# Patient Record
Sex: Male | Born: 1940 | Race: White | Hispanic: No | Marital: Married | Smoking: Former smoker
Health system: Southern US, Community
[De-identification: ages and names within clinical notes are randomized; demographics above are authoritative.]

## PROBLEM LIST (undated history)

## (undated) DIAGNOSIS — J690 Pneumonitis due to inhalation of food and vomit: Secondary | ICD-10-CM

## (undated) DIAGNOSIS — G20 Parkinson's disease: Secondary | ICD-10-CM

## (undated) DIAGNOSIS — J449 Chronic obstructive pulmonary disease, unspecified: Secondary | ICD-10-CM

## (undated) DIAGNOSIS — F419 Anxiety disorder, unspecified: Secondary | ICD-10-CM

## (undated) DIAGNOSIS — J189 Pneumonia, unspecified organism: Secondary | ICD-10-CM

## (undated) DIAGNOSIS — F32A Depression, unspecified: Secondary | ICD-10-CM

## (undated) DIAGNOSIS — M1990 Unspecified osteoarthritis, unspecified site: Secondary | ICD-10-CM

## (undated) DIAGNOSIS — F329 Major depressive disorder, single episode, unspecified: Secondary | ICD-10-CM

## (undated) DIAGNOSIS — J45909 Unspecified asthma, uncomplicated: Secondary | ICD-10-CM

## (undated) DIAGNOSIS — C44519 Basal cell carcinoma of skin of other part of trunk: Secondary | ICD-10-CM

## (undated) HISTORY — PX: HERNIA REPAIR: SHX51

## (undated) HISTORY — PX: ABDOMINAL HERNIA REPAIR: SHX539

## (undated) HISTORY — PX: BASAL CELL CARCINOMA EXCISION: SHX1214

---

## 2001-03-13 ENCOUNTER — Encounter: Payer: Self-pay | Admitting: Emergency Medicine

## 2001-03-13 ENCOUNTER — Emergency Department (HOSPITAL_COMMUNITY): Admission: EM | Admit: 2001-03-13 | Discharge: 2001-03-13 | Payer: Self-pay | Admitting: *Deleted

## 2001-10-26 ENCOUNTER — Ambulatory Visit (HOSPITAL_BASED_OUTPATIENT_CLINIC_OR_DEPARTMENT_OTHER): Admission: RE | Admit: 2001-10-26 | Discharge: 2001-10-26 | Payer: Self-pay | Admitting: Otolaryngology

## 2017-03-12 ENCOUNTER — Inpatient Hospital Stay (HOSPITAL_COMMUNITY)
Admission: EM | Admit: 2017-03-12 | Discharge: 2017-03-14 | DRG: 871 | Disposition: A | Discharge status: Hospice | Payer: Medicare Other | Attending: Internal Medicine | Admitting: Internal Medicine

## 2017-03-12 ENCOUNTER — Encounter (HOSPITAL_COMMUNITY): Payer: Self-pay

## 2017-03-12 ENCOUNTER — Emergency Department (HOSPITAL_COMMUNITY): Payer: Medicare Other

## 2017-03-12 DIAGNOSIS — Z6822 Body mass index (BMI) 22.0-22.9, adult: Secondary | ICD-10-CM | POA: Diagnosis not present

## 2017-03-12 DIAGNOSIS — J181 Lobar pneumonia, unspecified organism: Secondary | ICD-10-CM

## 2017-03-12 DIAGNOSIS — F0390 Unspecified dementia without behavioral disturbance: Secondary | ICD-10-CM | POA: Diagnosis present

## 2017-03-12 DIAGNOSIS — Z79899 Other long term (current) drug therapy: Secondary | ICD-10-CM

## 2017-03-12 DIAGNOSIS — L89151 Pressure ulcer of sacral region, stage 1: Secondary | ICD-10-CM | POA: Diagnosis present

## 2017-03-12 DIAGNOSIS — Z515 Encounter for palliative care: Secondary | ICD-10-CM | POA: Diagnosis present

## 2017-03-12 DIAGNOSIS — R0602 Shortness of breath: Secondary | ICD-10-CM

## 2017-03-12 DIAGNOSIS — G20 Parkinson's disease: Secondary | ICD-10-CM | POA: Diagnosis present

## 2017-03-12 DIAGNOSIS — E43 Unspecified severe protein-calorie malnutrition: Secondary | ICD-10-CM | POA: Diagnosis present

## 2017-03-12 DIAGNOSIS — J449 Chronic obstructive pulmonary disease, unspecified: Secondary | ICD-10-CM | POA: Diagnosis present

## 2017-03-12 DIAGNOSIS — Z87891 Personal history of nicotine dependence: Secondary | ICD-10-CM | POA: Diagnosis not present

## 2017-03-12 DIAGNOSIS — I10 Essential (primary) hypertension: Secondary | ICD-10-CM | POA: Diagnosis present

## 2017-03-12 DIAGNOSIS — R1310 Dysphagia, unspecified: Secondary | ICD-10-CM

## 2017-03-12 DIAGNOSIS — Z8701 Personal history of pneumonia (recurrent): Secondary | ICD-10-CM

## 2017-03-12 DIAGNOSIS — Z7982 Long term (current) use of aspirin: Secondary | ICD-10-CM | POA: Diagnosis not present

## 2017-03-12 DIAGNOSIS — Z66 Do not resuscitate: Secondary | ICD-10-CM | POA: Diagnosis present

## 2017-03-12 DIAGNOSIS — J690 Pneumonitis due to inhalation of food and vomit: Secondary | ICD-10-CM | POA: Diagnosis present

## 2017-03-12 DIAGNOSIS — I700 Atherosclerosis of aorta: Secondary | ICD-10-CM | POA: Diagnosis present

## 2017-03-12 DIAGNOSIS — F0280 Dementia in other diseases classified elsewhere without behavioral disturbance: Secondary | ICD-10-CM

## 2017-03-12 DIAGNOSIS — Z931 Gastrostomy status: Secondary | ICD-10-CM | POA: Diagnosis not present

## 2017-03-12 DIAGNOSIS — Z7189 Other specified counseling: Secondary | ICD-10-CM

## 2017-03-12 DIAGNOSIS — A419 Sepsis, unspecified organism: Secondary | ICD-10-CM | POA: Diagnosis present

## 2017-03-12 DIAGNOSIS — N400 Enlarged prostate without lower urinary tract symptoms: Secondary | ICD-10-CM | POA: Diagnosis present

## 2017-03-12 DIAGNOSIS — J189 Pneumonia, unspecified organism: Secondary | ICD-10-CM

## 2017-03-12 DIAGNOSIS — R0603 Acute respiratory distress: Secondary | ICD-10-CM

## 2017-03-12 HISTORY — DX: Basal cell carcinoma of skin of other part of trunk: C44.519

## 2017-03-12 HISTORY — DX: Major depressive disorder, single episode, unspecified: F32.9

## 2017-03-12 HISTORY — DX: Pneumonitis due to inhalation of food and vomit: J69.0

## 2017-03-12 HISTORY — DX: Unspecified osteoarthritis, unspecified site: M19.90

## 2017-03-12 HISTORY — DX: Parkinson's disease: G20

## 2017-03-12 HISTORY — DX: Anxiety disorder, unspecified: F41.9

## 2017-03-12 HISTORY — DX: Unspecified asthma, uncomplicated: J45.909

## 2017-03-12 HISTORY — DX: Chronic obstructive pulmonary disease, unspecified: J44.9

## 2017-03-12 HISTORY — DX: Depression, unspecified: F32.A

## 2017-03-12 HISTORY — DX: Pneumonia, unspecified organism: J18.9

## 2017-03-12 LAB — URINALYSIS, ROUTINE W REFLEX MICROSCOPIC
BILIRUBIN URINE: NEGATIVE
Bacteria, UA: NONE SEEN
GLUCOSE, UA: NEGATIVE mg/dL
Hgb urine dipstick: NEGATIVE
KETONES UR: NEGATIVE mg/dL
LEUKOCYTES UA: NEGATIVE
NITRITE: NEGATIVE
PH: 6 (ref 5.0–8.0)
Protein, ur: 30 mg/dL — AB
Specific Gravity, Urine: 1.023 (ref 1.005–1.030)

## 2017-03-12 LAB — CBC WITH DIFFERENTIAL/PLATELET
Basophils Absolute: 0 10*3/uL (ref 0.0–0.1)
Basophils Relative: 0 %
Eosinophils Absolute: 0.3 10*3/uL (ref 0.0–0.7)
Eosinophils Relative: 1 %
HCT: 36.1 % — ABNORMAL LOW (ref 39.0–52.0)
Hemoglobin: 11.4 g/dL — ABNORMAL LOW (ref 13.0–17.0)
Lymphocytes Relative: 5 %
Lymphs Abs: 1.6 10*3/uL (ref 0.7–4.0)
MCH: 29 pg (ref 26.0–34.0)
MCHC: 31.6 g/dL (ref 30.0–36.0)
MCV: 91.9 fL (ref 78.0–100.0)
Monocytes Absolute: 2 10*3/uL — ABNORMAL HIGH (ref 0.1–1.0)
Monocytes Relative: 6 %
Neutro Abs: 28.9 10*3/uL — ABNORMAL HIGH (ref 1.7–7.7)
Neutrophils Relative %: 88 %
Platelets: 188 10*3/uL (ref 150–400)
RBC: 3.93 MIL/uL — ABNORMAL LOW (ref 4.22–5.81)
RDW: 14.8 % (ref 11.5–15.5)
WBC: 32.8 10*3/uL — ABNORMAL HIGH (ref 4.0–10.5)

## 2017-03-12 LAB — COMPREHENSIVE METABOLIC PANEL
ALT: 19 U/L (ref 17–63)
AST: 18 U/L (ref 15–41)
Albumin: 2.6 g/dL — ABNORMAL LOW (ref 3.5–5.0)
Alkaline Phosphatase: 89 U/L (ref 38–126)
Anion gap: 10 (ref 5–15)
BUN: 56 mg/dL — ABNORMAL HIGH (ref 6–20)
CO2: 22 mmol/L (ref 22–32)
Calcium: 8.5 mg/dL — ABNORMAL LOW (ref 8.9–10.3)
Chloride: 111 mmol/L (ref 101–111)
Creatinine, Ser: 1.17 mg/dL (ref 0.61–1.24)
GFR calc Af Amer: >60 mL/min (ref >60)
GFR calc non Af Amer: 58 mL/min — ABNORMAL LOW (ref >60)
Glucose, Bld: 126 mg/dL — ABNORMAL HIGH (ref 65–99)
Potassium: 4.3 mmol/L (ref 3.5–5.1)
Sodium: 143 mmol/L (ref 135–145)
Total Bilirubin: 0.5 mg/dL (ref 0.3–1.2)
Total Protein: 6.2 g/dL — ABNORMAL LOW (ref 6.5–8.1)

## 2017-03-12 LAB — I-STAT CG4 LACTIC ACID, ED
LACTIC ACID, VENOUS: 1.19 mmol/L (ref 0.5–1.9)
Lactic Acid, Venous: 1.59 mmol/L (ref 0.5–1.9)

## 2017-03-12 LAB — I-STAT TROPONIN, ED: Troponin i, poc: 0.02 ng/mL (ref 0.00–0.08)

## 2017-03-12 MED ORDER — PIPERACILLIN-TAZOBACTAM 3.375 G IVPB
3.3750 g | Freq: Three times a day (TID) | INTRAVENOUS | Status: DC
  Administered 2017-03-12 – 2017-03-13 (×2): 3.375 g via INTRAVENOUS
  Filled 2017-03-12 (×3): qty 50

## 2017-03-12 MED ORDER — SODIUM CHLORIDE 0.9 % IV BOLUS (SEPSIS)
1000.0000 mL | Freq: Once | INTRAVENOUS | Status: AC
  Administered 2017-03-12: 1000 mL via INTRAVENOUS

## 2017-03-12 MED ORDER — VANCOMYCIN HCL IN DEXTROSE 750-5 MG/150ML-% IV SOLN
750.0000 mg | Freq: Two times a day (BID) | INTRAVENOUS | Status: DC
  Administered 2017-03-13: 750 mg via INTRAVENOUS
  Filled 2017-03-12 (×4): qty 150

## 2017-03-12 MED ORDER — VANCOMYCIN HCL IN DEXTROSE 750-5 MG/150ML-% IV SOLN
750.0000 mg | INTRAVENOUS | Status: AC
  Administered 2017-03-12: 750 mg via INTRAVENOUS
  Filled 2017-03-12: qty 150

## 2017-03-12 MED ORDER — PIPERACILLIN-TAZOBACTAM 3.375 G IVPB 30 MIN
3.3750 g | Freq: Once | INTRAVENOUS | Status: AC
  Administered 2017-03-12: 3.375 g via INTRAVENOUS
  Filled 2017-03-12: qty 50

## 2017-03-12 MED ORDER — ACETAMINOPHEN 650 MG RE SUPP
650.0000 mg | Freq: Once | RECTAL | Status: AC
  Administered 2017-03-12: 650 mg via RECTAL
  Filled 2017-03-12: qty 1

## 2017-03-12 MED ORDER — VANCOMYCIN HCL IN DEXTROSE 1-5 GM/200ML-% IV SOLN
1000.0000 mg | Freq: Once | INTRAVENOUS | Status: AC
  Administered 2017-03-12: 1000 mg via INTRAVENOUS
  Filled 2017-03-12: qty 200

## 2017-03-12 NOTE — ED Notes (Signed)
Condom cath placed.

## 2017-03-12 NOTE — Progress Notes (Signed)
Pharmacy Antibiotic Note  Darren Ewing is a 76 y.o. male admitted on 03/12/2017 with sepsis, possible PNA.  Pharmacy has been consulted for vancomycin/zosyn dosing. Tmax 101.9, LA 1.59. SCr 1.17 on admit, CrCl~53. Estimated weight ~160-170 lbs in the ED.  Zosyn 70mn infusion and vancomycin 1g IV x 1 already infusing in the ED.  Plan: Zosyn 3.375g IV (3108m inf) x1; then 3.375g IV q8h (4h inf) Vancomycin 175020m1g + additional 750m64mV x 1; then 750mg4mq12h Monitor clinical progress, c/s, renal function F/u de-escalation plan/LOT, vancomycin trough as indicated      Temp (24hrs), Avg:101.9 F (38.8 C), Min:101.9 F (38.8 C), Max:101.9 F (38.8 C)   Recent Labs Lab 03/12/17 1133  LATICACIDVEN 1.59    CrCl cannot be calculated (No order found.).    Allergies  Allergen Reactions  . Coconut Oil Fatty Acid Diethanolamide [Cocamide]     HaleyElicia LamprmD, BCPS Clinical Pharmacist Rx Phone # for today: #[phone] 3:30PM, please call Main Rx: #[id number] 11:51 AM

## 2017-03-12 NOTE — ED Notes (Signed)
Attempted report 

## 2017-03-12 NOTE — ED Provider Notes (Signed)
[facility] EMERGENCY DEPARTMENT Provider Note   CSN: [id number] Arrival date & time: 03/12/17  1053     History   Chief Complaint Chief Complaint  Patient presents with  . Pneumonia  . Fever    HPI Darren Ewing is a 76 y.o. male.  HPI Darren Ewing is a 76 y.o. male presents to ED with complaint of pneumonia. Hx provided by [facility] home staff who I spoke with directly. Pt apparently with recent aspiration problems, has had speech eval. Aspiration pneumonia 3 months ago. Noted to have fever and worsening cough yesterday.  Pt evaluated by PCP and diagnosed with pneumonia, started on levaquin. Today symptoms not improving, so sent here for evaluation. Pt is non verbal and responds to painful stimuli at baseline.   Past Medical History:  Diagnosis Date  . COPD (chronic obstructive pulmonary disease) ([place])   . Parkinson's disease ([place])     There are no active problems to display for this patient.   History reviewed. No pertinent surgical history.     Home Medications    Prior to Admission medications   Not on File    Family History History reviewed. No pertinent family history.  Social History Social History  Substance Use Topics  . Smoking status: Unknown If Ever Smoked  . Smokeless tobacco: Not on file  . Alcohol use No     Allergies   Coconut oil fatty acid diethanolamide [cocamide]   Review of Systems Review of Systems  Unable to perform ROS: Patient nonverbal     Physical Exam Updated Vital Signs BP 118/67   Pulse 77   Temp (!) 101.9 F (38.8 C) (Rectal)   Resp 19   SpO2 100%   Physical Exam  Constitutional: He appears well-developed.  HENT:  Head: Normocephalic and atraumatic.  Eyes: Pupils are equal, round, and reactive to light. Conjunctivae and EOM are normal.  Neck: Normal range of motion. Neck supple.  Cardiovascular: Normal rate, regular rhythm and normal heart sounds.   Pulmonary/Chest: Effort  normal. No respiratory distress. He has no wheezes. He has no rales.  Abdominal: Soft. Bowel sounds are normal. He exhibits no distension. There is no tenderness. There is no rebound.  Musculoskeletal: He exhibits no edema.  Neurological:  Responds to painful stimuli only. Left hand in a splint. Does not follow directions.   Skin: Skin is warm and dry.  Nursing note and vitals reviewed.    ED Treatments / Results  Labs (all labs ordered are listed, but only abnormal results are displayed) Labs Reviewed  CBC WITH DIFFERENTIAL/PLATELET - Abnormal; Notable for the following:       Result Value   WBC 32.8 (*)    RBC 3.93 (*)    Hemoglobin 11.4 (*)    HCT 36.1 (*)    Neutro Abs 28.9 (*)    Monocytes Absolute 2.0 (*)    All other components within normal limits  COMPREHENSIVE METABOLIC PANEL - Abnormal; Notable for the following:    Glucose, Bld 126 (*)    BUN 56 (*)    Calcium 8.5 (*)    Total Protein 6.2 (*)    Albumin 2.6 (*)    GFR calc non Af Amer 58 (*)    All other components within normal limits  URINALYSIS, ROUTINE W REFLEX MICROSCOPIC - Abnormal; Notable for the following:    Protein, ur 30 (*)    Squamous Epithelial / LPF 0-5 (*)    All other  components within normal limits  CULTURE, BLOOD (ROUTINE X 2)  CULTURE, BLOOD (ROUTINE X 2)  URINE CULTURE  I-STAT CG4 LACTIC ACID, ED  I-STAT TROPONIN, ED  I-STAT CG4 LACTIC ACID, ED    EKG  EKG Interpretation  Date/Time:  Wednesday March 12 2017 10:58:58 EDT Ventricular Rate:  82 PR Interval:    QRS Duration: 82 QT Interval:  390 QTC Calculation: 456 R Axis:   -22 Text Interpretation:  Sinus rhythm Left ventricular hypertrophy Normal sinus rhythm Confirmed by Thomasene Lot, [place] [phone]) on 03/12/2017 11:20:29 AM       Radiology Dg Chest Portable 1 View  Result Date: 03/12/2017 CLINICAL DATA:  Shortness of breath with cough and fever EXAM: PORTABLE CHEST 1 VIEW COMPARISON:  None. FINDINGS: There is airspace  consolidation throughout the left lower lobe with small left pleural effusion. There is mild right base atelectasis. Lungs elsewhere clear. Heart is upper normal in size with pulmonary vascularity within normal limits. No adenopathy. There is aortic atherosclerosis. No evident bone lesions. IMPRESSION: Airspace consolidation left lower lobe consistent with pneumonia. Small left pleural effusion. Right base atelectasis. Aortic atherosclerosis. Aortic Atherosclerosis (ICD10-I70.0). Electronically Signed   By: Lowella Grip III M.D.   On: 03/12/2017 11:36    Procedures Procedures (including critical care time)  Medications Ordered in ED Medications  vancomycin (VANCOCIN) IVPB 1000 mg/200 mL premix (1,000 mg Intravenous New Bag/Given 03/12/17 1158)  vancomycin (VANCOCIN) IVPB 750 mg/150 ml premix (not administered)  piperacillin-tazobactam (ZOSYN) IVPB 3.375 g (not administered)  vancomycin (VANCOCIN) IVPB 750 mg/150 ml premix (not administered)  sodium chloride 0.9 % bolus 1,000 mL (not administered)  acetaminophen (TYLENOL) suppository 650 mg (650 mg Rectal Given 03/12/17 1132)  sodium chloride 0.9 % bolus 1,000 mL (1,000 mLs Intravenous New Bag/Given 03/12/17 1130)  piperacillin-tazobactam (ZOSYN) IVPB 3.375 g (0 g Intravenous Stopped 03/12/17 1229)     Initial Impression / Assessment and Plan / ED Course  I have reviewed the triage vital signs and the nursing notes.  Pertinent labs & imaging results that were available during my care of the patient were reviewed by me and considered in my medical decision making (see chart for details).     Patient in emergency department with pneumonia from assisted-living facility. I discussed patient with the facility, appears to be at baseline mental status wise. Recent evaluation for possible aspiration. We'll repeat chest x-ray, labs, administer IV fluids. Vital signs are normal.   Temperature 101.9 rectally. Will administer Tylenol, will start  antibiotics. Initially antibiotics will be started for unknown source of infection. Chest x-ray pending at this time. Labs pending   Vital signs continued to be normal. Chest x-ray showing left lower lobe pneumonia. White blood cell count is impressively elevated, however normal lactic acid. Is not meet sepsis at this time. Will admit, receiving fluids and antibiotics. Discussed plan of admission with family.   Spoke with internal medicine teaching service, they will admit patient.   Vitals:   03/12/17 1454 03/12/17 1500 03/12/17 1502 03/12/17 1530  BP: 130/68 126/71  136/82  Pulse: 84 84  81  Resp: (!) 29 (!) 28  (!) 29  Temp:   99.3 F (37.4 C)   TempSrc:   Oral   SpO2: 96% 93%  97%     Final Clinical Impressions(s) / ED Diagnoses   Final diagnoses:  Community acquired pneumonia of left lower lobe of lung (HCC)    New Prescriptions New Prescriptions   No medications on file  Jeannett Senior, PA-C 03/12/17 1604    Macarthur Critchley, MD 03/17/17 1605

## 2017-03-12 NOTE — ED Notes (Signed)
Family at bedside, reason for delay explained to them.

## 2017-03-12 NOTE — ED Notes (Signed)
Pt is resting-appears comfortable.  Pt is non-verbal per norm.  Waiting for dispo

## 2017-03-12 NOTE — H&P (Signed)
Date: 03/12/2017               Patient Name:  Darren Ewing MRN: [id number]  DOB: [birth year] Age / Sex: 76 y.o., male   PCP: System, Provider Not In         Medical Service: Internal Medicine Teaching Service         Attending Physician: Dr. Heber Barton Rachel Moulds, DO    First Contact: Dr. Vickki Muff Pager: [phone]  Second Contact: Dr. Alphonzo Grieve Pager: [phone]       After Hours (After 5p/  First Contact Pager: [phone]  weekends / holidays): Second Contact Pager: [phone]     Chief Complaint: Pneumonia,   Level 5 caveat pt not able to communicate history taken from wife, and SNF records.    History of Present Illness: patient is a 76 year old male with past medical history of Parkinson's disease, dementia, hypertension, COPD, BPH, dysphagia who presented to the emergency department as a transfer from [facility] skilled nursing facility.  Patient has been treated for several aspiration pneumonias since September I believe this is the second one.  2 days ago the facility noticed the patient was aspirating.  They cut off his feeding tube thought that they might have seen some spitting up.  Yesterday morning they noticed the patient had a fever they performed a chest x-ray which showed a new pneumonia.  They began treatment with Levaquin 750 mg first dose was on October 16.  However patient's respiratory status continued to decline and they felt it was best that he be evaluated in the hospital.  The wife reports that before his first pneumonia back in September he was much more interactive.  She has noticed a gradual decline since then.  He for the most part was able to make eye contact and mouth words to her sometimes able to say a word or 2.  However the last few weeks she has noticed less eye contact, less speaking.   The wife reports that his COPD was never really bad and that she felt he had more of an asthma.  He was a former smoker but quit 35-40 years ago.  He was not on  oxygen at home had an albuterol inhaler as needed.  Patient received a formal diagnosis of Parkinson's around 5 years ago but the wife reports symptoms as far back as 10 years ago.    ED course: Patient arrived mildly tachypneic with a fever of 101.9 with a BP of 103/63.  CBC and CMP were performed showed a leukocytosis of 32.8 thousand, low albumin at 2.6.  Chest x ray demonstrated airspace consolidation throughout the left lower lobe with small left pleural effusion consistent with pneumonia a mild right base atelectasis Patient had lactic acid taken which was 1.59.  Blood cultures were taken, patient was started on empiric vancomycin and Zosyn.  Urinalysis were negative for signs of infection.    Meds:  Current Meds  Medication Sig  . acetaminophen (TYLENOL) 500 MG tablet Place 1,000 mg into feeding tube every 6 (six) hours as needed for mild pain.   Marland Kitchen aspirin 81 MG chewable tablet Place 81 mg into feeding tube daily.  . carbidopa-levodopa (SINEMET IR) 25-100 MG tablet Place 1 tablet into feeding tube every 8 (eight) hours.   . docusate (COLACE) 50 MG/5ML liquid Place 100 mg into feeding tube 2 (two) times daily as needed for mild constipation.  Marland Kitchen guaifenesin (ROBITUSSIN) 100 MG/5ML syrup Take 200 mg by  mouth every 4 (four) hours as needed for cough.  Marland Kitchen ipratropium-albuterol (DUONEB) 0.5-2.5 (3) MG/3ML SOLN Take 3 mLs by nebulization 3 (three) times daily as needed (shortness of breath).  . Magnesium Hydroxide 2400 MG/10ML SUSP Take 10 mLs by mouth daily as needed for mild constipation.  . metoprolol tartrate (LOPRESSOR) 25 MG tablet Place 25 mg into feeding tube 2 (two) times daily. Hold if BP >145/100   . mirtazapine (REMERON) 15 MG tablet Place 15 mg into feeding tube at bedtime.  . Nutritional Supplements (ISOSOURCE PO) Inject 70 mLs into the vein continuous.  Marland Kitchen omeprazole (PRILOSEC) 20 MG capsule Take 20 mg by mouth daily. PEG Tube  . Potassium Chloride 40 MEQ/15ML (20%) SOLN 20 mEq by PEG  Tube route 2 (two) times daily.  . tamsulosin (FLOMAX) 0.4 MG CAPS capsule Take 0.8 mg by mouth daily after breakfast. PEG Tube     Allergies: Allergies as of 03/12/2017  . (No Known Allergies)   Past Medical History:  Diagnosis Date  . COPD (chronic obstructive pulmonary disease) ([place])   . Parkinson's disease ([place])     Family History: History reviewed. No pertinent family history.  Social History:  Social History   Social History  . Marital status: Married    Spouse name: N/A  . Number of children: N/A  . Years of education: N/A   Occupational History  . Not on file.   Social History Main Topics  . Smoking status: Unknown If Ever Smoked  . Smokeless tobacco: Not on file  . Alcohol use No  . Drug use: No  . Sexual activity: Not on file   Other Topics Concern  . Not on file   Social History Narrative  . No narrative on file    Review of Systems: A complete ROS was negative except as per HPI. ROS limited by patient not able to communicate  Physical Exam: Blood pressure 118/65, pulse 84, temperature (!) 101.9 F (38.8 C), temperature source Rectal, resp. rate (!) 24, SpO2 95 %. Physical Exam  Constitutional: He appears lethargic. No distress.  HENT:  Head: Normocephalic and atraumatic.  Eyes: Right eye exhibits no discharge. Left eye exhibits no discharge. No scleral icterus.  Neck: No JVD present.  Cardiovascular: Normal rate, regular rhythm and normal heart sounds.  Exam reveals no friction rub.   No murmur heard. Pulmonary/Chest: Effort normal and breath sounds normal. No stridor. No respiratory distress. He has no wheezes. He has no rales.  Abdominal: Soft. Bowel sounds are normal. He exhibits no distension. There is no tenderness.  Musculoskeletal: He exhibits no edema or deformity.  Neurological: He appears lethargic.  Skin: Skin is warm and dry. He is not diaphoretic.    EKG: personally reviewed my interpretation is sinus rhythm, LVH  CXR:  personally reviewed my interpretation is  airspace consolidation throughout the left lower lobe with small left pleural effusion consistent with pneumonia a mild right base atelectasis    Assessment & Plan by Problem: Active Problems:   CAP (community acquired pneumonia)  75 year old male with past medical history of Parkinson's disease, dementia, hypertension, COPD, BPH, dysphagia, presents with acute episode of recurrent aspiration pneumonia.    Pneumonia: acute episode of recurrent aspiration pneumonia, this is around the third episode in the last 2 months.  Pt found to have left lower lobe pneumonia.  Has had 1 day of Levaquin 750 mg at the skilled nursing facility so far.  -Will continue IV Vanc/Zosyn -patient saturating well on 2  L nasal cannula -Pt needs good respiratory care with frequent suctioning -Pt/Ot  -cardiac monitoring -repeat CBC/BMP -Important to have a discussion with the family on goals of care due to this being a recurrent issue and patient continuing to decline.  COPD: pt not on O2 at home,pts wife feels like he has more like an asthma picture, pt hasn't smoked in around 40 years.  He has a PRN albuterol inhaler that he rarely uses.  -PRN albuterol  Parkinsons: pt appears to be in continual decline per wife, less interactive.   -continue carbidopa-levodopa  Dispo: Admit patient to Inpatient with expected length of stay greater than 2 midnights.  Signed: Katherine Roan, MD 03/12/2017, 2:43 PM  Vickki Muff MD PGY-1 Internal Medicine Pager # [phone]

## 2017-03-12 NOTE — ED Triage Notes (Signed)
[facility]- pt coming from [facility] facility with complaint of pneumonia. Pt is taking levaquin at SNF for pneumonia. Pt running a fever today, RR of 42, BP initially 82 palpated with EMS. IV in place. Pt lethargic.

## 2017-03-13 DIAGNOSIS — L89151 Pressure ulcer of sacral region, stage 1: Secondary | ICD-10-CM | POA: Diagnosis present

## 2017-03-13 DIAGNOSIS — R52 Pain, unspecified: Secondary | ICD-10-CM

## 2017-03-13 LAB — CBC
HEMATOCRIT: 35.3 % — AB (ref 39.0–52.0)
HEMOGLOBIN: 11.1 g/dL — AB (ref 13.0–17.0)
MCH: 29.1 pg (ref 26.0–34.0)
MCHC: 31.4 g/dL (ref 30.0–36.0)
MCV: 92.7 fL (ref 78.0–100.0)
Platelets: 181 10*3/uL (ref 150–400)
RBC: 3.81 MIL/uL — ABNORMAL LOW (ref 4.22–5.81)
RDW: 14.7 % (ref 11.5–15.5)
WBC: 35.1 10*3/uL — AB (ref 4.0–10.5)

## 2017-03-13 LAB — BASIC METABOLIC PANEL
ANION GAP: 10 (ref 5–15)
BUN: 40 mg/dL — AB (ref 6–20)
CHLORIDE: 113 mmol/L — AB (ref 101–111)
CO2: 20 mmol/L — ABNORMAL LOW (ref 22–32)
Calcium: 8.4 mg/dL — ABNORMAL LOW (ref 8.9–10.3)
Creatinine, Ser: 1.1 mg/dL (ref 0.61–1.24)
GFR calc Af Amer: >60 mL/min (ref >60)
GFR calc non Af Amer: >60 mL/min (ref >60)
GLUCOSE: 110 mg/dL — AB (ref 65–99)
POTASSIUM: 3.6 mmol/L (ref 3.5–5.1)
Sodium: 143 mmol/L (ref 135–145)

## 2017-03-13 LAB — MRSA PCR SCREENING: MRSA by PCR: NEGATIVE

## 2017-03-13 MED ORDER — HALOPERIDOL LACTATE 2 MG/ML PO CONC: 0.5000 mg | ORAL | Status: DC | PRN

## 2017-03-13 MED ORDER — IPRATROPIUM-ALBUTEROL 0.5-2.5 (3) MG/3ML IN SOLN: 3.0000 mL | RESPIRATORY_TRACT | Status: DC | PRN

## 2017-03-13 MED ORDER — PANTOPRAZOLE SODIUM 40 MG PO PACK
40.0000 mg | PACK | Freq: Every day | ORAL | Status: DC
  Administered 2017-03-13 – 2017-03-14 (×2): 40 mg
  Filled 2017-03-13: qty 20

## 2017-03-13 MED ORDER — CARBIDOPA-LEVODOPA 25-100 MG PO TABS
1.0000 | ORAL_TABLET | Freq: Three times a day (TID) | ORAL | Status: DC
  Administered 2017-03-13 – 2017-03-14 (×5): 1
  Filled 2017-03-13 (×8): qty 1

## 2017-03-13 MED ORDER — ASPIRIN 81 MG PO CHEW
81.0000 mg | CHEWABLE_TABLET | Freq: Every day | ORAL | Status: DC
  Administered 2017-03-13: 81 mg
  Filled 2017-03-13: qty 1

## 2017-03-13 MED ORDER — ACETAMINOPHEN 160 MG/5ML PO SOLN: 1000.0000 mg | Freq: Four times a day (QID) | ORAL | Status: DC | PRN

## 2017-03-13 MED ORDER — JEVITY 1.2 CAL PO LIQD
1000.0000 mL | ORAL | Status: DC
  Filled 2017-03-13 (×3): qty 1000

## 2017-03-13 MED ORDER — SENNOSIDES-DOCUSATE SODIUM 8.6-50 MG PO TABS
1.0000 | ORAL_TABLET | Freq: Every day | ORAL | Status: DC
  Administered 2017-03-13: 1
  Filled 2017-03-13: qty 1

## 2017-03-13 MED ORDER — ENOXAPARIN SODIUM 40 MG/0.4ML ~~LOC~~ SOLN
40.0000 mg | Freq: Every day | SUBCUTANEOUS | Status: DC
  Administered 2017-03-13: 40 mg via SUBCUTANEOUS
  Filled 2017-03-13: qty 0.4

## 2017-03-13 MED ORDER — TAMSULOSIN HCL 0.4 MG PO CAPS: 0.8000 mg | ORAL_CAPSULE | Freq: Every day | ORAL | Status: DC

## 2017-03-13 MED ORDER — GLYCOPYRROLATE 0.2 MG/ML IJ SOLN
0.2000 mg | INTRAMUSCULAR | Status: DC | PRN
Start: 2017-03-13 — End: 2017-03-14
  Filled 2017-03-13: qty 1

## 2017-03-13 MED ORDER — MORPHINE SULFATE (PF) 4 MG/ML IV SOLN
2.0000 mg | INTRAVENOUS | Status: DC | PRN
  Administered 2017-03-13 – 2017-03-14 (×5): 2 mg via INTRAVENOUS
  Filled 2017-03-13 (×5): qty 1

## 2017-03-13 MED ORDER — POLYVINYL ALCOHOL 1.4 % OP SOLN
1.0000 [drp] | Freq: Four times a day (QID) | OPHTHALMIC | Status: DC | PRN
  Filled 2017-03-13: qty 15

## 2017-03-13 MED ORDER — BIOTENE DRY MOUTH MT LIQD: 15.0000 mL | OROMUCOSAL | Status: DC | PRN

## 2017-03-13 MED ORDER — DOCUSATE SODIUM 50 MG/5ML PO LIQD: 100.0000 mg | Freq: Two times a day (BID) | ORAL | Status: DC | PRN

## 2017-03-13 MED ORDER — GUAIFENESIN 100 MG/5ML PO SOLN: 200.0000 mg | ORAL | Status: DC | PRN

## 2017-03-13 MED ORDER — ONDANSETRON 4 MG PO TBDP: 4.0000 mg | ORAL_TABLET | Freq: Four times a day (QID) | ORAL | Status: DC | PRN

## 2017-03-13 MED ORDER — GLYCOPYRROLATE 1 MG PO TABS
1.0000 mg | ORAL_TABLET | ORAL | Status: DC | PRN
  Filled 2017-03-13: qty 1

## 2017-03-13 MED ORDER — HALOPERIDOL 0.5 MG PO TABS: 0.5000 mg | ORAL_TABLET | ORAL | Status: DC | PRN

## 2017-03-13 MED ORDER — ONDANSETRON HCL 4 MG/2ML IJ SOLN: 4.0000 mg | Freq: Four times a day (QID) | INTRAMUSCULAR | Status: DC | PRN

## 2017-03-13 MED ORDER — HALOPERIDOL LACTATE 5 MG/ML IJ SOLN: 0.5000 mg | INTRAMUSCULAR | Status: DC | PRN

## 2017-03-13 MED ORDER — SODIUM CHLORIDE 0.9 % IV SOLN
3.0000 g | Freq: Four times a day (QID) | INTRAVENOUS | Status: DC
  Filled 2017-03-13 (×2): qty 3

## 2017-03-13 MED ORDER — GLYCOPYRROLATE 0.2 MG/ML IJ SOLN
0.2000 mg | INTRAMUSCULAR | Status: DC | PRN
  Administered 2017-03-14: 0.2 mg via INTRAVENOUS
  Filled 2017-03-13: qty 1

## 2017-03-13 NOTE — Progress Notes (Signed)
Patient's family requested Dr. Heber [place] and myself to come up to the room and discuss goals of care.  The daughter mentions that the patient was requesting that the family "let him go" and asked "why am I still here?"  The family had a group discussion and agreed that the best thing for the patient was to not suffer any further which is very reasonable. We spent the remainder of our time discussing different treatment options and avenues to pursue those options including hospice, palliative care.  The decision was made to stop further aggressive therapies for the pneumonia but instead focus on making the patient comfortable in treating symptoms.

## 2017-03-13 NOTE — Progress Notes (Signed)
PT Cancellation Note  Patient Details Name: Darren Ewing MRN: [id number] DOB: 04-27-1941   Cancelled Treatment:    Reason Eval/Treat Not Completed: Other (comment).  Pt had indicated to family that he does not want extensive measures taken, and will ck tomorrow to see if any reconsideration of tx is done.  If not will dc the PT order.   Ramond Dial 03/13/2017, 2:36 PM   Mee Hives, PT MS Acute Rehab Dept. Number: [place] and [place]

## 2017-03-13 NOTE — Progress Notes (Signed)
   Subjective:  No acute events overnight, patient somewhat more responsive today is making eye contact, trying to speak but mainly muffling words.  We asked if he was in pain he may have said un huh.    Objective:  Vital signs in last 24 hours: Vitals:   03/13/17 0124 03/13/17 0524 03/13/17 0834 03/13/17 0836  BP: 127/74 125/73  124/80  Pulse: 82 77  83  Resp: 14 12  13   Temp: 99.1 F (37.3 C) 98.5 F (36.9 C)  98.3 F (36.8 C)  TempSrc:    Oral  SpO2: 98% 100% 98% 97%  Weight: 160 lb (72.6 kg)      Physical Exam  Eyes: Right eye exhibits no discharge. Left eye exhibits no discharge. No scleral icterus.  Cardiovascular: Normal rate, regular rhythm, normal heart sounds and intact distal pulses.  Exam reveals no gallop and no friction rub.   No murmur heard. Pulmonary/Chest: Effort normal and breath sounds normal. No respiratory distress. He has no wheezes. He has no rales.  Pt did have some gurgling and tried to cough but with difficulty.    Abdominal: Soft. Bowel sounds are normal. He exhibits no distension and no mass. There is no tenderness. There is no guarding.  Peg tube in place, clean dry, non erythematous around site.   Musculoskeletal: He exhibits no edema or deformity.  Neurological:  Able to make some eye contact today.  Pt was trying to talk but very muffled, hard to comprehend.      Assessment/Plan:  Active Problems:   Aspiration pneumonia (HCC)   Sepsis ([place])   Parkinson's disease ([place])   Aortic atherosclerosis ([place])  Pneumonia: acute episode of recurrent aspiration pneumonia   -Pt MRSA neg, Will narrow antibiotic to unasysn -patient was 97% this am on room air -Pt needs good respiratory care with frequent suctioning -Pt/Ot  -cardiac monitoring -Trend CBC -Important to have a discussion with the family on goals of care due to this being a recurrent issue and patient continuing to decline.   COPD: pt not on O2 at home,pts wife feels like he has more like  an asthma picture, pt hasn't smoked in around 40 years.  He has a PRN albuterol inhaler that he rarely uses.   -PRN albuterol   Parkinsons: pt appears to be in continual decline per wife, less interactive.    -continue carbidopa-levodopa  Pain: unsure of where pt is in pain due to difficulty speaking  -Tylenol PRN for mild pain, IV morphine 2mg  every 4 hours if pt appears to be in pain or endorses pain  Dispo: Anticipated discharge in approximately 1-2 day(s).   Katherine Roan, MD 03/13/2017, 10:00 AM Vickki Muff MD PGY-1 Internal Medicine Pager # [phone]

## 2017-03-13 NOTE — Progress Notes (Signed)
Responded to [facility] consult to support patient. Daughter and grandson at bedside and pt.'s wife gone for lunch. Prayed with pt and family .  Provided empathetic listening,emotional, spiritual and grief support.  Patient opened his eyes after prayer. Chaplain available as needed.   03/13/17 1448  Clinical Encounter Type  Visited With Patient and family together  Visit Type Initial;Spiritual support;Patient actively dying  Referral From Chaplain;Nurse  Spiritual Encounters  Spiritual Needs Prayer;Emotional;Grief support  Stress Factors  Family Stress Factors Health changes;Loss  Cristopher Peru, [facility], Pager [phone]

## 2017-03-13 NOTE — Progress Notes (Signed)
PT Cancellation Note  Patient Details Name: Darren Ewing MRN: [id number] DOB: 12/26/1940   Cancelled Treatment:    Reason Eval/Treat Not Completed: Patient's level of consciousness.  Quite lethargic and with family, who state he just arrived to his room very early this AM.  Will try to see him later today.   Ramond Dial 03/13/2017, 8:17 AM    Mee Hives, PT MS Acute Rehab Dept. Number: [place] and [place]

## 2017-03-13 NOTE — Progress Notes (Signed)
Patient received to unit via stretcher, non verbal and lethagic at this time but responds to painful stimuli. Patient placed on a low bed and bed alarm activated. Call bell within reach of patient. Hearing aids removed and placed in a container labelled with patient's label and placed on night stand by patient's bed. Suction set up in room

## 2017-03-13 NOTE — Care Management Note (Signed)
Case Management Note  Patient Details  Name: Darren Ewing MRN: [id number] Date of Birth: 08/24/1940  Subjective/Objective:     CM following for progression and d/c planning.                Action/Plan: 03/13/2017 CM referral for home hospice or residential hospice. Will order CSW referral for possible residential hospice. Await Palliative care recommendations.   Expected Discharge Date:                  Expected Discharge Plan:   (Home with Hospice vs Residential Hospice.)  In-House Referral:  Clinical Social Work  Discharge planning Services  CM Consult  Post Acute Care Choice:    Choice offered to:     DME Arranged:    DME Agency:     HH Arranged:    [place] Agency:     Status of Service:  In process, will continue to follow  If discussed at Long Length of Stay Meetings, dates discussed:    Additional Comments:  Adron Bene, RN 03/13/2017, 4:34 PM

## 2017-03-13 NOTE — Progress Notes (Signed)
Internal Medicine Attending:   I saw and examined the patient. I reviewed the resident's note and I agree with the resident's findings and plan as documented in the resident's note. More interactive this morning- did give a grunt to show understanding but otherwise non verbal.  Improvement in following commands today.  Multiple family and friends at bedside, wife present.  There are concerned he is in some pain which he did appear to agree with but did not localize.  Given improvement in respiratory status I think it is reasonable to add PRN morphine.  Otherwise his prognosis is still guarded but improving.  Agree with D/C of Vanc and change of ZOsyn to Unasyn.

## 2017-03-13 NOTE — Progress Notes (Signed)
Initial Nutrition Assessment  DOCUMENTATION CODES:   Severe malnutrition in context of chronic illness  INTERVENTION:   Tube Feeding:   Jevity 1.2 @ 70 ml/hr providing 2016 kcals, 93 g of protein 1361 mL of free water.   Recommend addition of free water flushes of 25 ml/hr providing total of 1961 mL of free water.    NUTRITION DIAGNOSIS:   Malnutrition (Severe) related to chronic illness (Parkinsons' Disease, dementia, COPD, recurrent aspiration pneumonia) as evidenced by severe depletion of muscle mass, severe depletion of body fat.  GOAL:   Patient will meet greater than or equal to 90% of their needs  MONITOR:   TF tolerance, Labs, Weight trends, Skin  REASON FOR ASSESSMENT:   Consult Enteral/tube feeding initiation and management  ASSESSMENT:   76 yo male admitted with aspiration pneumonia. Pt wih hx of Parkinson's Dz, dementia, HTN, COPD, dysphagia with G-tube placement  Family at bedside. Pt with eyes open but minimally responsive. Pt copious secretions, respiratory at bedside and pt NT suctioned with good success.  Per family, pt does not take anything by mouth. Pt receives Isosource at rate of 70 ml/hr continuously via G-tube (provides 2016 kcals, 91 g of protein, 1361 mL of free water. Family reports that sometimes when visiting pt at SNF, [facility] is not elevated. Family is aware of the increased risk of aspiration when [facility] is not >30 degrees.   Family reports that back in August pt was able to sit up in a chair, get into wheel chair and work some with PT. However over the last 6 weeks pt has been bed bound.   Family is unsure if pt has lost any weight recently; no prior weight encounters listed  Nutrition-Focused physical exam completed. Findings are mild/moderate to severe fat depletion, mild/moderate to severe muscle depletion, and mild edema.   Labs: reviewed Meds: reviewed  Diet Order:  Diet NPO time specified  Skin:  Wound (see comment) (stage I  coccyx)  Last BM:  no documented BM  Height:   Ht Readings from Last 1 Encounters:  03/13/17 5\' 10"  (1.778 m)    Weight:   Wt Readings from Last 1 Encounters:  03/13/17 160 lb 0.9 oz (72.6 kg)    Ideal Body Weight:  75.4 kg  BMI:  Body mass index is 22.97 kg/m.  Estimated Nutritional Needs:   Kcal:  1800-2000 kcals  Protein:  90-100 g  Fluid:  >/= 1.8 L  EDUCATION NEEDS:   No education needs identified at this time  [place], [place], LDN [phone] Pager  [phone] Weekend/On-Call Pager

## 2017-03-13 NOTE — Progress Notes (Signed)
Pharmacy Antibiotic Note  Darren Ewing is a 76 y.o. male admitted on 03/12/2017 with pneumonia and sepsis.  Pharmacy has been consulted for Unasyn dosing. Tmax 101.9 and WBC count continues to trend up to 35.1 from 32.8 on admission. Scr is stable at 1.17. Patient previously on vancomycin and zosyn, but have been discontinued. There is concern for aspiration pneumonia at this time as there is concern patient was spitting up some tube feedings at outside facility. Also noted that patient did receive levaquin x 1 day at outside facility before transfer.   Plan: Unasyn 3g IV q6 hours  Continue to monitor clinical progression, LOT, and cultures  Will continue to follow for plan for de-escalation  Height: _0  (177.8 cm) Weight: 160 lb 0.9 oz (72.6 kg) IBW/kg (Calculated) : 73  Temp (24hrs), Avg:99.2 F (37.3 C), Min:98.3 F (36.8 C), Max:101.9 F (38.8 C)   Recent Labs Lab 03/12/17 1116 03/12/17 1133 03/12/17 1519 03/13/17 0356  WBC 32.8*  --   --  35.1*  CREATININE 1.17  --   --  1.10  LATICACIDVEN  --  1.59 1.19  --     Estimated Creatinine Clearance: 57.8 mL/min (by C-G formula based on SCr of 1.1 mg/dL).    No Known Allergies  Antimicrobials this admission: Vanc 10/17>>10/18 Zosyn 10/17>>10/18 Unasyn 10/18>>   Microbiology results: 10/17 MRSA PCR: Negative  10/17 BCx: pending 10/17 Ucx: pending    Thank you for allowing pharmacy to be a part of this patient's care.  Jalene Mullet, Pharm.D. PGY1 Pharmacy Resident 03/13/2017 10:28 AM Main Pharmacy: [phone]

## 2017-03-14 ENCOUNTER — Encounter (HOSPITAL_COMMUNITY): Payer: Self-pay | Admitting: General Practice

## 2017-03-14 DIAGNOSIS — Z7189 Other specified counseling: Secondary | ICD-10-CM

## 2017-03-14 DIAGNOSIS — G20 Parkinson's disease: Secondary | ICD-10-CM

## 2017-03-14 DIAGNOSIS — Z515 Encounter for palliative care: Secondary | ICD-10-CM

## 2017-03-14 DIAGNOSIS — J181 Lobar pneumonia, unspecified organism: Secondary | ICD-10-CM

## 2017-03-14 DIAGNOSIS — J189 Pneumonia, unspecified organism: Secondary | ICD-10-CM

## 2017-03-14 DIAGNOSIS — R0602 Shortness of breath: Secondary | ICD-10-CM

## 2017-03-14 MED ORDER — POLYVINYL ALCOHOL 1.4 % OP SOLN: 1.0000 [drp] | Freq: Four times a day (QID) | OPHTHALMIC | 0 refills | Status: AC | PRN

## 2017-03-14 MED ORDER — MORPHINE SULFATE (PF) 4 MG/ML IV SOLN: 2.0000 mg | INTRAVENOUS | 0 refills | Status: AC | PRN

## 2017-03-14 MED ORDER — GLYCOPYRROLATE 0.2 MG/ML IJ SOLN: 0.2000 mg | INTRAMUSCULAR | 0 refills | Status: AC | PRN

## 2017-03-14 MED ORDER — BIOTENE DRY MOUTH MT LIQD: 15.0000 mL | OROMUCOSAL | 0 refills | Status: AC | PRN

## 2017-03-14 MED ORDER — ONDANSETRON 4 MG PO TBDP: ORAL_TABLET | ORAL | 0 refills | Status: AC

## 2017-03-14 NOTE — Progress Notes (Signed)
Internal Medicine Attending:   I saw and examined the patient. I reviewed the resident's note and I agree with the resident's findings and plan as documented in the resident's note. Appear to be resting comfortably, family in room- appreciative, the feels he is more comfortable but has been less responsive this morning.  On my exam, furrowed brow, did not respond to voice or touch,  No respiratory distress, heart RRR. Appreciate palliative cares assistance with transition to inpatient hospice care,  Family is appreciated of all staff at [facility] and express their gratitude. We did discuss option of transition from intermittent IV morphine to morphine drip if needed.

## 2017-03-14 NOTE — Progress Notes (Addendum)
   Subjective:  No acute events overnight, patient not able to communicate today, daughter said pt slept well throughout the night, does not appear to be in pain or discomfort.    Objective:  Vital signs in last 24 hours: Vitals:   03/13/17 1103 03/13/17 1104 03/13/17 2141 03/13/17 2200  BP:   134/77 125/69  Pulse:   81 82  Resp:   11 16  Temp:   97.9 F (36.6 C)   TempSrc:      SpO2: 93% 96% 98% 98%  Weight:      Height:       Physical Exam  Eyes: Right eye exhibits no discharge. Left eye exhibits no discharge. No scleral icterus.  Cardiovascular: Normal rate, regular rhythm, normal heart sounds and intact distal pulses.  Exam reveals no gallop and no friction rub.   No murmur heard. Pulmonary/Chest: Effort normal and breath sounds normal. No respiratory distress. He has no wheezes. He has no rales.  Abdominal: Soft. Bowel sounds are normal. He exhibits no distension and no mass. There is no tenderness. There is no guarding.  Peg tube in place, clean dry, non erythematous around site.   Musculoskeletal: He exhibits no edema or deformity.  Neurological:  Patient back to being unresponsive, not making eye contact    Assessment/Plan:  Active Problems:   Aspiration pneumonia (HCC)   Sepsis ([place])   Parkinson's disease ([place])   Aortic atherosclerosis ([place])   Pressure ulcer of sacral region, stage 1  Pneumonia: acute episode of recurrent aspiration pneumonia   -after discussion with family pt is now full comfort care -hospice/palliative care consulted -palliative discussed with family, decided to try for placement at [facility]  -case management working on referral now   COPD: pt not on O2 at home,pts wife feels like he has more like an asthma picture, pt hasn't smoked in around 40 years.  He has a PRN albuterol inhaler that he rarely uses.   -PRN albuterol   Parkinsons: pt appears to be in continual decline per wife, less interactive.    -continue  carbidopa-levodopa  Pain: unsure of where pt is in pain due to difficulty speaking  -Tylenol PRN for mild pain, IV morphine 2mg  every 4 hours if pt appears to be in pain or endorses pain -palliative care recommends either continue current pain regimen or can consider continuous morphine infusion, with PRN for breakthrough.    Dispo: Anticipated discharge in approximately 1-2 day(s).   Katherine Roan, MD 03/14/2017, 8:04 AM Vickki Muff MD PGY-1 Internal Medicine Pager # [phone]

## 2017-03-14 NOTE — Progress Notes (Signed)
Patient Discharge: Disposition: Patient discharged to [facility]. IV: Peripheral IVs was not removed as per doctor's order. Transportation: patient was transported by the EMS. Belongings: Patient's family took all his belongings with them.

## 2017-03-14 NOTE — Progress Notes (Signed)
Nutrition Brief Note  Chart reviewed. Pt now transitioning to comfort care. Tube Feedings orders were discontinued by MD No further nutrition interventions warranted at this time.  Please re-consult as needed.   Kerman Passey MS, RD, LDN [phone] Pager  [phone] Weekend/On-Call Pager

## 2017-03-14 NOTE — Progress Notes (Signed)
Turned Pt to supine, due to sun shining directly in his face.

## 2017-03-14 NOTE — Consult Note (Signed)
[facility] [place]: Met with family at request of CM Brule at bedside. Discussed pt's poor prognosis, disease progression, goals of care and Hospice home philosophy. Family in agreement to pt transferring to our facility. Daughter signe dconsents. Spoke to Dr. Estill Cotta Hospice medical director they did approve him for GIP level of care. [facility] called at 230pm to pick pt up at 330pm. Nurse update on status and pt has vbeen discharged to Hospice home in [place] [phone]  Lorriane Shire MSW  aware and updated. CK

## 2017-03-14 NOTE — Clinical Social Work Note (Signed)
CSW received consult for residential hospice placement and per wife and daughter, preference is [facility]. Second choice [facility] and they were able to accept patient today. Patient discharged to hospice facility today and transport arranged by  Webb Silversmith, RN with [facility].  Danikah Budzik Givens, MSW, LCSW Licensed Clinical Social Worker [place] [phone]

## 2017-03-14 NOTE — Care Management Note (Signed)
Case Management Note  Patient Details  Name: Darren Ewing MRN: [id number] Date of Birth: December 15, 1940  Subjective/Objective:         CM following for progression and d/c planning..           Action/Plan: 03/14/2017 Per CSW, Crawford Givens pt family has selected residential hospice services. Pt wil d/c to [facility].   Expected Discharge Date:    03/14/2017              Expected Discharge Plan:  [place] (Home with Hospice vs Residential Hospice.)  In-House Referral:  Clinical Social Work  Discharge planning Services  CM Consult  Post Acute Care Choice:   NA Choice offered to:   NA  DME Arranged:   NA DME Agency:   NA  HH Arranged:   NA HH Agency:   NA  Status of Service:  Completed, signed off  If discussed at [employer] [facility] [employer], dates discussed:    Additional Comments:  Adron Bene, RN 03/14/2017, 1:44 PM

## 2017-03-14 NOTE — Consult Note (Signed)
Consultation Note Date: 03/14/2017   Patient Name: Darren Ewing  DOB: 1940/09/14  MRN: [id number]  Age / Sex: 76 y.o., male  PCP: System, Provider Not In Referring Physician: Lucious Groves, DO  Reason for Consultation: Establishing goals of care  HPI/Patient Profile: 76 y.o. male   admitted on 03/12/2017    Clinical Assessment and Goals of Care: 76 yo gentleman, originally from [place], [place], with past medical history significant for Parkinson's disease, [place], has had gradual progressive decline for the past few months with ongoing recurrent aspiration events, requiring hospitalization and SNF rehab attempts.    The patient relocated to [place], [place] with his wife a few weeks ago, during the time [place]. Since then, he has had accelerated functional decline. He came from [facility] facility this time.   The patient is hospitalized now, with pneumonia and sepsis. He was initially placed on antibiotics, it is noted that he has a PEG tube, has been spitting up tube feedings recently.   The patient is noted to have made his wishes clear to his family members, that he did not want to continue current mode of care, he wished to focus exclusively on comfort measures.   The patient has since been on IV Morphine PRN, his tube feedings are d/c, a palliative consult has been placed for additional goals of care discussions.   The patient is resting in bed, he has become non verbal since the past 24 hours or so, he is resting in bed, appears comfortable for now, he has coarse breath sounds, some rattle also going on, his brother and daughters are by his bedside. I introduced myself and palliative care as follows: Palliative medicine is specialized medical care for people living with serious illness. It focuses on providing relief from the symptoms and  stress of a serious illness. The goal is to improve quality of life for both the patient and the family.  The patient's family states that he has suffered a lot of decline from his parkinson's, he has always been clear about his wishes and goals. They would like to continue with comfort measures. We discussed about hospice philosophy of care, the patient, in my opinion, would likely benefit from a residential hospice setting, he might have a prognosis of 2 weeks or less.   CSW and hospice to please call daughter Marcie Bal first [phone].   See below:   NEXT OF KIN  wife, has 3 daughters Originally from [place], [place].  Has moved in with daughter who lives in [place], [place] for the past few weeks, since [place] a few weeks ago.   SUMMARY OF RECOMMENDATIONS    Agree with DNR DNI.  Agree with full comfort care.  Agree with current end-of-life care medications, consider Morphine continuous infusion with availability of bolus dosages if patient has severe symptoms of dyspnea, choking, recurrent aspiration, ok to continue with PRN IV Morphine for now, in  my opinion.   Prognosis: less than 2 weeks, discussed frankly and compassionately with family present in the room.  Disposition: recommend residential hospice, family prefers [place], [place], will initiate CSW consult to help facilitate.   Thank you for the consult.  Code Status/Advance Care Planning:  DNR    Symptom Management:    as above   Palliative Prophylaxis:   Delirium Protocol  Additional Recommendations (Limitations, Scope, Preferences):  Full Comfort Care  Psycho-social/Spiritual:   Desire for further Chaplaincy support:yes  Additional Recommendations: Education on Hospice  Prognosis:   < 2 weeks  Discharge Planning: Hospice facility      Primary Diagnoses: Present on Admission: . Aspiration pneumonia ([place]) . Sepsis ([place]) . Parkinson's disease  ([place]) . Aortic atherosclerosis ([place]) . Pressure ulcer of sacral region, stage 1   I have reviewed the medical record, interviewed the patient and family, and examined the patient. The following aspects are pertinent.  Past Medical History:  Diagnosis Date  . COPD (chronic obstructive pulmonary disease) ([place])   . Parkinson's disease [facility])    Social History   Social History  . Marital status: Married    Spouse name: N/A  . Number of children: N/A  . Years of education: N/A   Social History Main Topics  . Smoking status: Unknown If Ever Smoked  . Smokeless tobacco: None  . Alcohol use No  . Drug use: No  . Sexual activity: Not Asked   Other Topics Concern  . None   Social History Narrative  . None   History reviewed. No pertinent family history. Scheduled Meds: . carbidopa-levodopa  1 tablet Per Tube Q8H  . pantoprazole sodium  40 mg Per Tube Daily  . senna-docusate  1 tablet Per Tube QHS  . tamsulosin  0.8 mg Oral Daily   Continuous Infusions: PRN Meds:.acetaminophen, antiseptic oral rinse, docusate, glycopyrrolate **OR** glycopyrrolate **OR** glycopyrrolate, guaiFENesin, ipratropium-albuterol, morphine injection, ondansetron **OR** ondansetron (ZOFRAN) IV, polyvinyl alcohol Medications Prior to Admission:  Prior to Admission medications   Medication Sig Start Date End Date Taking? Authorizing Provider  acetaminophen (TYLENOL) 500 MG tablet Place 1,000 mg into feeding tube every 6 (six) hours as needed for mild pain.    Yes [provider]  aspirin 81 MG chewable tablet Place 81 mg into feeding tube daily.   Yes [provider]  carbidopa-levodopa (SINEMET IR) 25-100 MG tablet Place 1 tablet into feeding tube every 8 (eight) hours.    Yes [provider]  docusate (COLACE) 50 MG/5ML liquid Place 100 mg into feeding tube 2 (two) times daily as needed for mild constipation.   Yes [provider]  guaifenesin (ROBITUSSIN) 100 MG/5ML  syrup Take 200 mg by mouth every 4 (four) hours as needed for cough.   Yes [provider]  ipratropium-albuterol (DUONEB) 0.5-2.5 (3) MG/3ML SOLN Take 3 mLs by nebulization 3 (three) times daily as needed (shortness of breath).   Yes [provider]  Magnesium Hydroxide 2400 MG/10ML SUSP Take 10 mLs by mouth daily as needed for mild constipation.   Yes [provider]  metoprolol tartrate (LOPRESSOR) 25 MG tablet Place 25 mg into feeding tube 2 (two) times daily. Hold if BP >145/100    Yes [provider]  mirtazapine (REMERON) 15 MG tablet Place 15 mg into feeding tube at bedtime.   Yes [provider]  Nutritional Supplements (ISOSOURCE PO) Inject 70 mLs into the vein continuous.   Yes [provider]  omeprazole (PRILOSEC) 20 MG capsule Take 20 mg by mouth daily. PEG Tube   Yes [provider]  Potassium Chloride 40 MEQ/15ML (20%) SOLN 20 mEq by PEG Tube route 2 (two) times daily.   Yes [provider]  tamsulosin (FLOMAX) 0.4 MG CAPS capsule Take 0.8 mg by mouth daily after breakfast. PEG Tube   Yes [provider]   No Known Allergies Review of Systems Unresponsive   Physical Exam Not awake Weak elderly gentleman Resting in bed Coarse breath sounds Has PEG Not on tube feeds, full comfort care Trace edema No coolness no mottling yet Does not have non verbal gestures of distress/discomfort: no wincing, no grimacing, no clenching of teeth, no furrowing of brow noted.  Unresponsive  Vital Signs: BP 125/69   Pulse 82   Temp 97.9 F (36.6 C)   Resp 16   Ht _0  (1.778 m)   Wt 72.6 kg (160 lb 0.9 oz)   SpO2 98%   BMI 22.97 kg/m  Pain Assessment: PAINAD       SpO2: SpO2: 98 % O2 Device:SpO2: 98 % O2 Flow Rate: .O2 Flow Rate (L/min): 3 L/min  IO: Intake/output summary:  Intake/Output Summary (Last 24 hours) at 03/14/17 0940 Last data filed at 03/14/17 7412  Gross per 24 hour  Intake                 0 ml  Output              850 ml  Net             -850 ml   PPS 10% LBM:   Baseline Weight: Weight: 72.6 kg (160 lb) Most recent weight: Weight: 72.6 kg (160 lb 0.9 oz)     Palliative Assessment/Data:   Flowsheet Rows     Most Recent Value  Intake Tab  Referral Department  Hospitalist  Unit at Time of Referral  Med/Surg Unit  Palliative Care Primary Diagnosis  Other (Comment) [parkinson's disease, recurrent asp pna. ]  Palliative Care Type  New Palliative care  Reason for referral  Non-pain Symptom, Clarify Goals of Care, Counsel Regarding Hospice, Psychosocial or Spiritual support  Date first seen by Palliative Care  03/14/17  Clinical Assessment  Palliative Performance Scale Score  10%  Pain Max last 24 hours  4  Pain Min Last 24 hours  3  Dyspnea Max Last 24 Hours  5  Dyspnea Min Last 24 hours  4  Nausea Max Last 24 Hours  1  Nausea Min Last 24 Hours  0  Anxiety Max Last 24 Hours  4  Anxiety Min Last 24 Hours  3  Psychosocial & Spiritual Assessment  Palliative Care Outcomes  Patient/Family meeting held?  Yes  Who was at the meeting?  patient's brother, 2 daughters.   Palliative Care Outcomes  Clarified goals of care      Time In:  8 Time Out:9.10   Time Total:  70 min  Greater than 50%  of this time was spent counseling and coordinating care related to the above assessment and plan.  Signed by: Loistine Chance, MD  [phone]  Please contact Palliative Medicine Team phone at [phone] for questions and concerns.  For individual provider: See Shea Evans

## 2017-03-14 NOTE — Discharge Summary (Signed)
Name: Darren Ewing MRN: [id number] DOB: [birth year] 76 y.o. PCP: System, Provider Not In  Date of Admission: 03/12/2017 10:53 AM Date of Discharge: 03/14/2017 Attending Physician: Lucious Groves, DO  Discharge Diagnosis: 1.  Aspiration Pneumonia Parkinson's disease COPD Active Problems:   Aspiration pneumonia (HCC)   Sepsis ([place])   Parkinson's disease ([place])   Aortic atherosclerosis ([place])   Pressure ulcer of sacral region, stage 1   Community acquired pneumonia of left lower lobe of lung ([place])   Encounter for palliative care   Goals of care, counseling/discussion   Shortness of breath   Discharge Medications: Allergies as of 03/14/2017   No Known Allergies     Medication List    STOP taking these medications   aspirin 81 MG chewable tablet   ISOSOURCE PO   metoprolol tartrate 25 MG tablet Commonly known as:  LOPRESSOR   mirtazapine 15 MG tablet Commonly known as:  REMERON   Potassium Chloride 40 MEQ/15ML (20%) Soln     TAKE these medications   acetaminophen 500 MG tablet Commonly known as:  TYLENOL Place 1,000 mg into feeding tube every 6 (six) hours as needed for mild pain.   antiseptic oral rinse Liqd Apply 15 mLs topically as needed for dry mouth.   carbidopa-levodopa 25-100 MG tablet Commonly known as:  SINEMET IR Place 1 tablet into feeding tube every 8 (eight) hours.   docusate 50 MG/5ML liquid Commonly known as:  COLACE Place 100 mg into feeding tube 2 (two) times daily as needed for mild constipation.   glycopyrrolate 0.2 MG/ML injection Commonly known as:  ROBINUL Inject 1 mL (0.2 mg total) into the vein every 4 (four) hours as needed (excessive secretions).   guaifenesin 100 MG/5ML syrup Commonly known as:  ROBITUSSIN Take 200 mg by mouth every 4 (four) hours as needed for cough.   ipratropium-albuterol 0.5-2.5 (3) MG/3ML Soln Commonly known as:  DUONEB Take 3 mLs by nebulization 3 (three) times daily as needed (shortness of  breath).   Magnesium Hydroxide 2400 MG/10ML Susp Take 10 mLs by mouth daily as needed for mild constipation.   morphine 4 MG/ML injection Inject 0.5 mLs (2 mg total) into the vein every 4 (four) hours as needed for moderate pain or severe pain.   omeprazole 20 MG capsule Commonly known as:  PRILOSEC Take 20 mg by mouth daily. PEG Tube   ondansetron 4 MG disintegrating tablet Commonly known as:  ZOFRAN-ODT 16m every 6 hours as needed for nausea, deliver per tube   polyvinyl alcohol 1.4 % ophthalmic solution Commonly known as:  LIQUIFILM TEARS Place 1 drop into both eyes 4 (four) times daily as needed for dry eyes.   tamsulosin 0.4 MG Caps capsule Commonly known as:  FLOMAX Take 0.8 mg by mouth daily after breakfast. PEG Tube       Disposition and follow-up:   [name]  At the hospital follow up visit please address:  1.    Aspiration Pneumonia -Pt on full comfort care, will be important to manage secretions, suction as needed, pain control  COPD -PRN duoneb therapy for comfort  2.  Labs / imaging needed at time of follow-up: none  3.  Pending labs/ test needing follow-up: none  Hospital Course by problem list: [place] Problems:   [place]   [place]   [place]inson's disease [place]   [place]   Pressure ulcer of sacral  region, stage 1   Community acquired pneumonia of left lower lobe of lung ([place])   Encounter for palliative care   Goals of care, counseling/discussion   Shortness of breath   1.  Aspiration Pneumonia: Pt presented to the emergency department as a transfer from [facility] skilled nursing facility.  Patient has been treated for several aspiration pneumonias since September I believe this is the second or third one.  2 days prior to admission, the patient's skilled nursing facility noticed the patient was aspirating.  They cut off his  feeding tube thought that they might have seen some spitting up.  Yesterday morning they noticed the patient had a fever they performed a chest x-ray which showed a new left lower lobe pneumonia.  They began treatment with Levaquin 750 mg first dose was on October 16.  However patient's respiratory status continued to decline and they felt it was best that he be evaluated in the hospital.  The wife reports that before his first pneumonia back in September he was much more interactive.  She has noticed a gradual decline since then.  He for the most part was able to make eye contact and mouth words to her sometimes able to say a word or 2.  However the last few weeks she has noticed less eye contact, less speaking.   The wife reports that his COPD was never really bad and that she felt he had more of an asthma.  He was a former smoker but quit 35-40 years ago.  He was not on oxygen at home or in the SNF but had an albuterol inhaler as needed.  Patient received a formal diagnosis of Parkinson's around 5 years ago but the wife reports symptoms as far back as 10 years ago.     Patient arrived to the emergency department mildly tachypneic with a fever of 101.9 with a BP of 103/63.  CBC and CMP were performed showed a leukocytosis of 32.8 thousand, low albumin at 2.6.  Chest x ray demonstrated airspace consolidation throughout the left lower lobe with small left pleural effusion consistent with pneumonia a mild right base atelectasis Patient had lactic acid taken which was 1.59.  Blood cultures were taken, patient was started on empiric vancomycin and Zosyn.  Urinalysis were negative for signs of infection.    The following morning the patient was afebrile, saturating 98-100% on room air.  Was somewhat more responsive and was able to make eye contact and spoke the occasional muffled word of agreement.  His white count was mildly elevated to 35,000.  He was found to be MRSA negative by nasal swab and the decision was  made to discontinue vancomycin and change Zosyn to Unasyn.    Later that same day about 1 PM the family requested we come up and talk about goals of care.  The daughter mentioned that the patient requested that the family "let him go" and asked "why am I still hear?" The family had agreed discussion and agreed that the best thing for the patient was to not suffer any further.  We discussed different treatment options and avenues to pursue those options including hospice, palliative care.  The decision was made to discontinue antibiotics tube feeds and transition to full comfort care.  His wife Darren Ewing is his HCPOA and was in full agreement.  A referral to hospice and palliative care was made.    The following morning, palliative care came by to see the family and patient.  Full  comfort care was continued and recommendation for residential hospice was made.  Patient was offered bed at [facility] with transfer the same day, which family accepted.   Discharge Vitals:   BP 135/77 (BP Location: Right Arm)   Pulse 81   Temp 99.9 F (37.7 C) (Axillary)   Resp 17   Ht _0  (1.778 m)   Wt 160 lb 0.9 oz (72.6 kg)   SpO2 96%   BMI 22.97 kg/m   Pertinent Labs, Studies, and Procedures:   CBC Latest Ref Rng & Units 03/13/2017 03/12/2017  WBC 4.0 - 10.5 K/uL 35.1(H) 32.8(H)  Hemoglobin 13.0 - 17.0 g/dL 11.1(L) 11.4(L)  Hematocrit 39.0 - 52.0 % 35.3(L) 36.1(L)  Platelets 150 - 400 K/uL 181 188   BMP Latest Ref Rng & Units 03/13/2017 03/12/2017  Glucose 65 - 99 mg/dL 110(H) 126(H)  BUN 6 - 20 mg/dL 40(H) 56(H)  Creatinine 0.61 - 1.24 mg/dL 1.10 1.17  Sodium 135 - 145 mmol/L 143 143  Potassium 3.5 - 5.1 mmol/L 3.6 4.3  Chloride 101 - 111 mmol/L 113(H) 111  CO2 22 - 32 mmol/L 20(L) 22  Calcium 8.9 - 10.3 mg/dL 8.4(L) 8.5(L)   Portable Chest X-ray:  FINDINGS: There is airspace consolidation throughout the left lower lobe with small left pleural effusion. There is mild right base  atelectasis. Lungs elsewhere clear. Heart is upper normal in size with pulmonary vascularity within normal limits. No adenopathy. There is aortic atherosclerosis. No evident bone lesions.   IMPRESSION: Airspace consolidation left lower lobe consistent with pneumonia. Small left pleural effusion. Right base atelectasis.  Discharge Instructions: Discharge Instructions    Discharge instructions    Complete by:  As directed      Signed: Alphonzo Grieve, MD 03/14/2017, 2:48 PM

## 2017-03-17 LAB — CULTURE, BLOOD (ROUTINE X 2)
CULTURE: NO GROWTH
CULTURE: NO GROWTH

## 2017-03-27 DEATH — deceased

## 2018-08-03 IMAGING — DX DG CHEST 1V PORT
1 series · 1 of 1 positions shown · non-contrast
Comparison: None.

CLINICAL DATA: Shortness of breath with cough and fever

EXAM:
PORTABLE CHEST 1 VIEW

[chest ap]
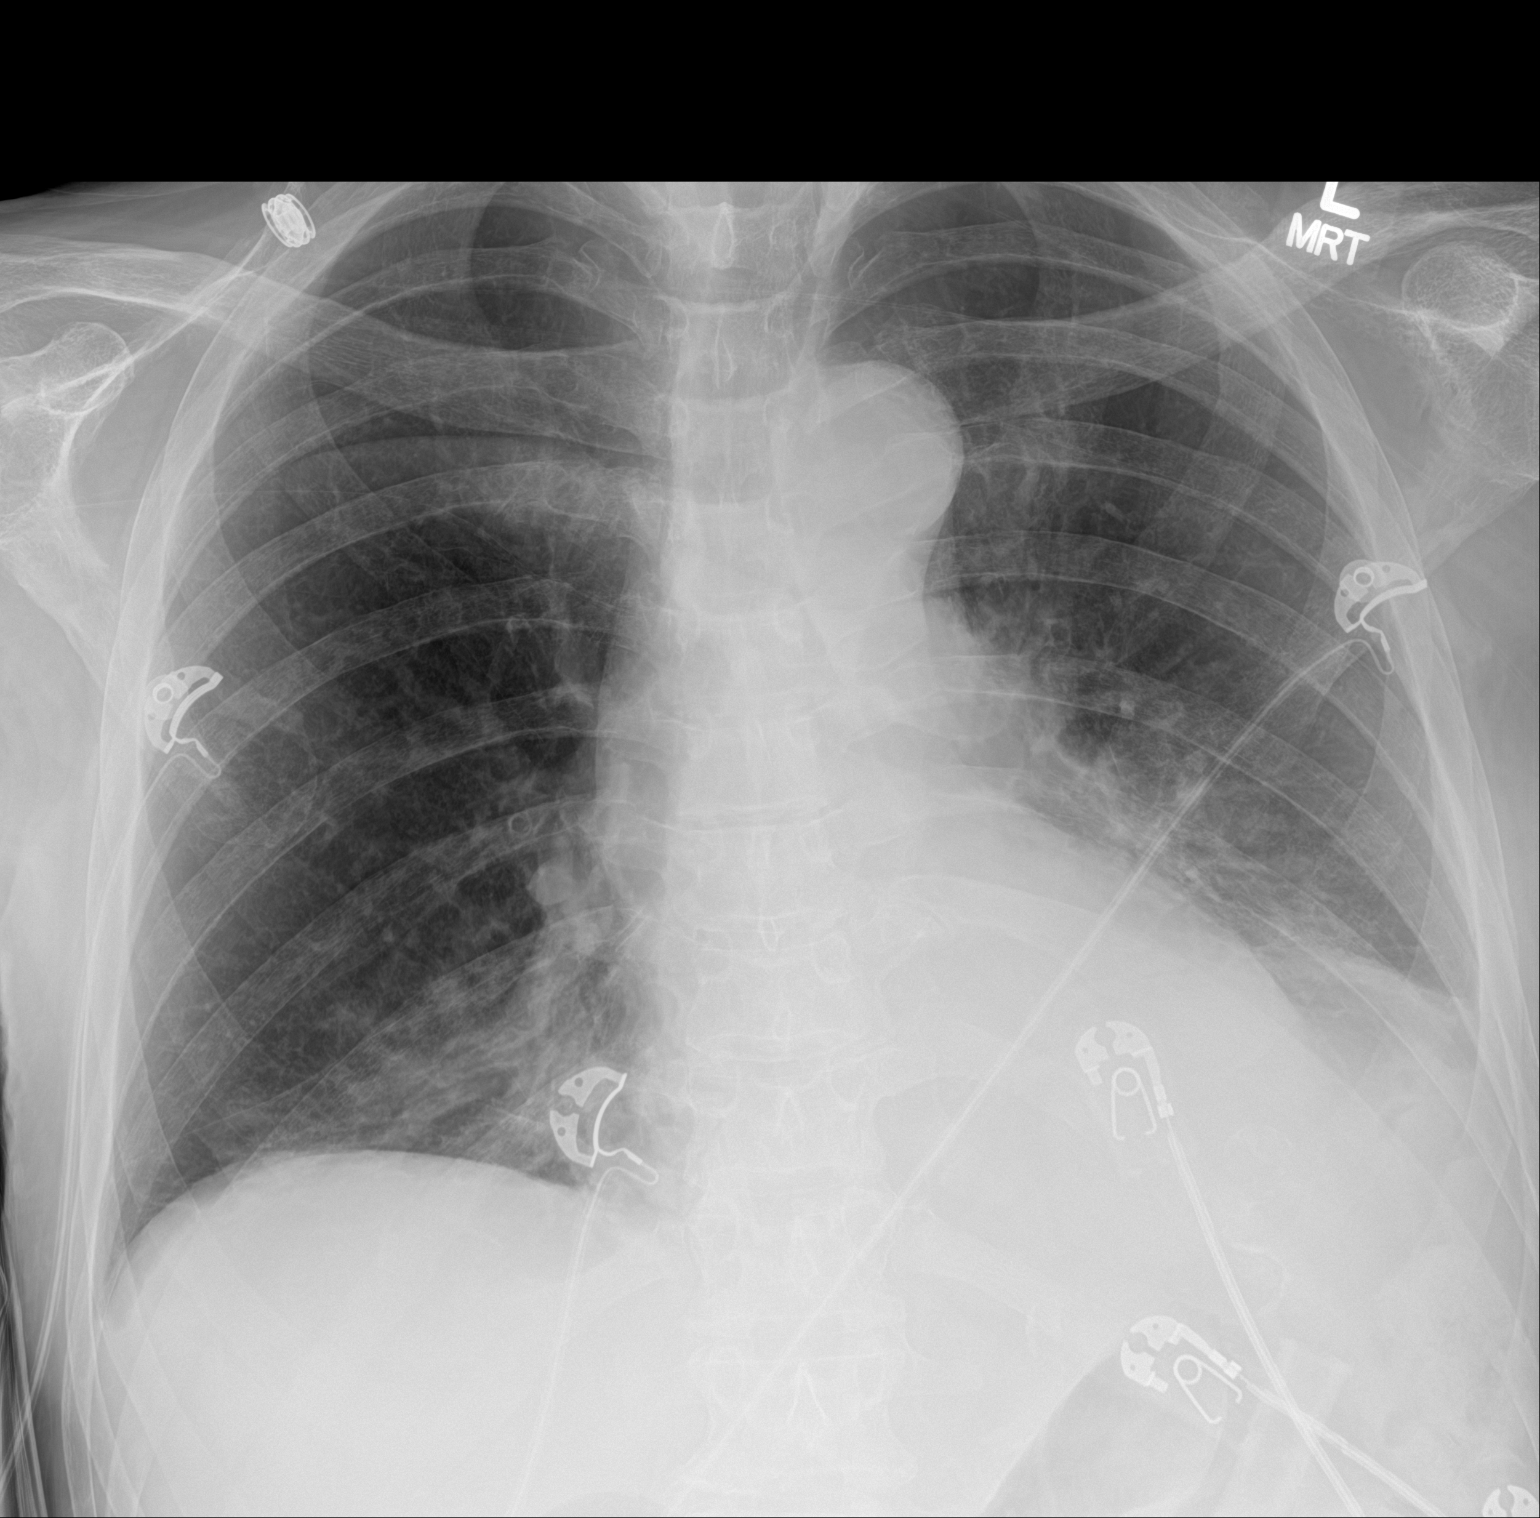

[1 of 1 positions shown; findings below may reference images not displayed]

FINDINGS: There is airspace consolidation throughout the left lower lobe with
small left pleural effusion. There is mild right base atelectasis.
Lungs elsewhere clear. Heart is upper normal in size with pulmonary
vascularity within normal limits. No adenopathy. There is aortic
atherosclerosis. No evident bone lesions.
IMPRESSION: Airspace consolidation left lower lobe consistent with pneumonia.
Small left pleural effusion. Right base atelectasis.

Aortic atherosclerosis.

Aortic Atherosclerosis ([id number].M).
